# Patient Record
Sex: Female | Born: 2000 | Race: White | Hispanic: No | Marital: Single | Smoking: Never smoker
Health system: Southern US, Community
[De-identification: ages and names within clinical notes are randomized; demographics above are authoritative.]

---
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
## 2020-01-25 ENCOUNTER — Other Ambulatory Visit: Payer: Self-pay

## 2020-01-25 NOTE — Progress Notes (Signed)
[facility] Specimen # [id number]  AMD

## 2020-08-11 ENCOUNTER — Other Ambulatory Visit: Payer: Self-pay

## 2020-08-11 ENCOUNTER — Ambulatory Visit
Admission: EM | Admit: 2020-08-11 | Discharge: 2020-08-11 | Disposition: A | Discharge status: Home / Self Care | Payer: BC Managed Care – PPO | Attending: Emergency Medicine | Admitting: Emergency Medicine

## 2020-08-11 ENCOUNTER — Encounter: Payer: Self-pay | Admitting: Emergency Medicine

## 2020-08-11 DIAGNOSIS — J029 Acute pharyngitis, unspecified: Secondary | ICD-10-CM | POA: Diagnosis not present

## 2020-08-11 LAB — POCT RAPID STREP A (OFFICE): Rapid Strep A Screen: NEGATIVE

## 2020-08-11 NOTE — Discharge Instructions (Signed)
Your strep testing is negative today, we have sent it to be cultured to confirm.   This is likely viral in nature and should improve in the next week.   Throat lozenges, gargles, chloraseptic spray, warm teas, popsicles etc to help with throat pain.   Push fluids to ensure adequate hydration and keep secretions thin.  Tylenol and/or ibuprofen as needed for pain or fevers.  If symptoms worsen or do not improve in the next week to return to be seen or to follow up with your PCP.

## 2020-08-11 NOTE — ED Triage Notes (Addendum)
Patient in office today at [facility] c/o Sorethroat with swollen lymph nodes x 2d .  Two at covid test which were negative and only request strep test OTC: Advil

## 2020-08-11 NOTE — ED Provider Notes (Signed)
Megan Gross    CSN: [id number] Arrival date & time: 08/11/20  7829      History   Chief Complaint Chief Complaint  Patient presents with  . Sore Throat    Bilateral side    HPI Megan Gross is a 19 y.o. female.   Megan Gross presents with complaints of sore throat which started three days ago. Mild headache. No fevers. She had diarrhea prior to onset, but states her family have had gi illness also. This has improved. Has been vaccinated for covid 19 and has taken two tests at home which have been negative. No cough or congestion. No ear pain. No known ill contacts.     ROS per HPI, negative if not otherwise mentioned.      History reviewed. No pertinent past medical history.  There are no problems to display for this patient.   History reviewed. No pertinent surgical history.  OB History   No obstetric history on file.      Home Medications    Prior to Admission medications   Not on File    Family History No family history on file.  Social History Social History   Tobacco Use  . Smoking status: Never Smoker  . Smokeless tobacco: Never Used     Allergies   Sulfamethoxazole-trimethoprim   Review of Systems Review of Systems   Physical Exam Triage Vital Signs ED Triage Vitals  Enc Vitals Group     BP 08/11/20 0945 102/61     Pulse Rate 08/11/20 0945 84     Resp 08/11/20 0945 16     Temp 08/11/20 0945 99.1 F (37.3 C)     Temp Source 08/11/20 0945 Oral     SpO2 08/11/20 0945 96 %     Weight 08/11/20 0940 115 lb (52.2 kg)     Height 08/11/20 0940 5\' 2"  (1.575 m)     Head Circumference --      Peak Flow --      Pain Score 08/11/20 0939 6     Pain Loc --      Pain Edu? --      Excl. in GC? --    No data found.  Updated Vital Signs BP 102/61 (BP Location: Right Arm)   Pulse 84   Temp 99.1 F (37.3 C) (Oral)   Resp 16   Ht 5\' 2"  (1.575 m)   Wt 115 lb (52.2 kg)   LMP 07/28/2020   SpO2 96%   BMI 21.03 kg/m    Visual Acuity Right Eye Distance:   Left Eye Distance:   Bilateral Distance:    Right Eye Near:   Left Eye Near:    Bilateral Near:     Physical Exam Constitutional:      General: She is not in acute distress.    Appearance: She is well-developed.  HENT:     Head: Normocephalic.     Right Ear: Tympanic membrane and ear canal normal.     Left Ear: Tympanic membrane and ear canal normal.     Mouth/Throat:     Pharynx: Uvula midline. Posterior oropharyngeal erythema present. No uvula swelling.     Tonsils: Tonsillar exudate present. 2+ on the right.  Cardiovascular:     Rate and Rhythm: Normal rate.  Pulmonary:     Effort: Pulmonary effort is normal.  Skin:    General: Skin is warm and dry.  Neurological:     Mental Status: She is alert  and oriented to person, place, and time.      UC Treatments / Results  Labs (all labs ordered are listed, but only abnormal results are displayed) Labs Reviewed  CULTURE, GROUP A STREP [facility])  POCT RAPID STREP A (OFFICE)    EKG   Radiology No results found.  Procedures Procedures (including critical care time)  Medications Ordered in UC Medications - No data to display  Initial Impression / Assessment and Plan / UC Course  I have reviewed the triage vital signs and the nursing notes.  Pertinent labs & imaging results that were available during my care of the patient were reviewed by me and considered in my medical decision making (see chart for details).     Negative rapid strep with culture pending. Declines covid testing today. History and physical consistent with viral illness.  Supportive cares recommended. Return precautions provided. Patient verbalized understanding and agreeable to plan.   Final Clinical Impressions(s) / UC Diagnoses   Final diagnoses:  Exudative pharyngitis     Discharge Instructions     Your strep testing is negative today, we have sent it to be cultured to confirm.   This is likely  viral in nature and should improve in the next week.   Throat lozenges, gargles, chloraseptic spray, warm teas, popsicles etc to help with throat pain.   Push fluids to ensure adequate hydration and keep secretions thin.  Tylenol and/or ibuprofen as needed for pain or fevers.  If symptoms worsen or do not improve in the next week to return to be seen or to follow up with your PCP.      ED Prescriptions    None     PDMP not reviewed this encounter.   Georgetta Haber, NP 08/11/20 1022

## 2020-08-14 LAB — CULTURE, GROUP A STREP (THRC)
